# Patient Record
Sex: Male | Born: 1985 | Race: White | Hispanic: No | Marital: Single | Smoking: Current every day smoker
Health system: Southern US, Community
[De-identification: ages and names within clinical notes are randomized; demographics above are authoritative.]

## PROBLEM LIST (undated history)

## (undated) DIAGNOSIS — L409 Psoriasis, unspecified: Secondary | ICD-10-CM

## (undated) HISTORY — PX: CYST EXCISION: SHX5701

---

## 2017-10-17 ENCOUNTER — Encounter (HOSPITAL_COMMUNITY): Payer: Self-pay | Admitting: *Deleted

## 2017-10-17 ENCOUNTER — Emergency Department (HOSPITAL_COMMUNITY)
Admission: EM | Admit: 2017-10-17 | Discharge: 2017-10-17 | Disposition: A | Discharge status: Home / Self Care | Payer: BLUE CROSS/BLUE SHIELD | Attending: Emergency Medicine | Admitting: Emergency Medicine

## 2017-10-17 ENCOUNTER — Emergency Department (HOSPITAL_COMMUNITY): Payer: BLUE CROSS/BLUE SHIELD

## 2017-10-17 DIAGNOSIS — Y999 Unspecified external cause status: Secondary | ICD-10-CM | POA: Diagnosis not present

## 2017-10-17 DIAGNOSIS — T148XXA Other injury of unspecified body region, initial encounter: Secondary | ICD-10-CM

## 2017-10-17 DIAGNOSIS — S0990XA Unspecified injury of head, initial encounter: Secondary | ICD-10-CM

## 2017-10-17 DIAGNOSIS — R0789 Other chest pain: Secondary | ICD-10-CM | POA: Diagnosis not present

## 2017-10-17 DIAGNOSIS — S199XXA Unspecified injury of neck, initial encounter: Secondary | ICD-10-CM | POA: Diagnosis present

## 2017-10-17 DIAGNOSIS — S161XXA Strain of muscle, fascia and tendon at neck level, initial encounter: Secondary | ICD-10-CM | POA: Diagnosis not present

## 2017-10-17 DIAGNOSIS — M25512 Pain in left shoulder: Secondary | ICD-10-CM | POA: Insufficient documentation

## 2017-10-17 DIAGNOSIS — Y92410 Unspecified street and highway as the place of occurrence of the external cause: Secondary | ICD-10-CM | POA: Diagnosis not present

## 2017-10-17 DIAGNOSIS — S0081XA Abrasion of other part of head, initial encounter: Secondary | ICD-10-CM | POA: Diagnosis not present

## 2017-10-17 DIAGNOSIS — Y9389 Activity, other specified: Secondary | ICD-10-CM | POA: Insufficient documentation

## 2017-10-17 DIAGNOSIS — M545 Low back pain: Secondary | ICD-10-CM | POA: Diagnosis not present

## 2017-10-17 DIAGNOSIS — F17210 Nicotine dependence, cigarettes, uncomplicated: Secondary | ICD-10-CM | POA: Insufficient documentation

## 2017-10-17 HISTORY — DX: Psoriasis, unspecified: L40.9

## 2017-10-17 MED ORDER — ACETAMINOPHEN 325 MG PO TABS
650.0000 mg | ORAL_TABLET | Freq: Once | ORAL | Status: AC
  Administered 2017-10-17: 650 mg via ORAL
  Filled 2017-10-17: qty 2

## 2017-10-17 MED ORDER — CYCLOBENZAPRINE HCL 10 MG PO TABS: 10.0000 mg | ORAL_TABLET | Freq: Two times a day (BID) | ORAL | 0 refills | Status: AC | PRN
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
## 2017-10-17 NOTE — ED Notes (Signed)
ED Provider at bedside. 
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
